# Patient Record
Sex: Female | Born: 1980
Health system: Southern US, Community
[De-identification: ages and names within clinical notes are randomized; demographics above are authoritative.]

## PROBLEM LIST (undated history)

## (undated) DIAGNOSIS — Z9889 Other specified postprocedural states: Secondary | ICD-10-CM

## (undated) DIAGNOSIS — T8859XA Other complications of anesthesia, initial encounter: Secondary | ICD-10-CM

## (undated) DIAGNOSIS — J302 Other seasonal allergic rhinitis: Secondary | ICD-10-CM

## (undated) DIAGNOSIS — T4145XA Adverse effect of unspecified anesthetic, initial encounter: Secondary | ICD-10-CM

## (undated) DIAGNOSIS — R112 Nausea with vomiting, unspecified: Secondary | ICD-10-CM

## (undated) HISTORY — PX: WISDOM TOOTH EXTRACTION: SHX21

---

## 1998-08-03 ENCOUNTER — Other Ambulatory Visit: Admission: RE | Admit: 1998-08-03 | Discharge: 1998-08-03 | Payer: Self-pay | Admitting: Obstetrics and Gynecology

## 1999-10-02 ENCOUNTER — Other Ambulatory Visit: Admission: RE | Admit: 1999-10-02 | Discharge: 1999-10-02 | Payer: Self-pay | Admitting: Obstetrics and Gynecology

## 2000-12-03 ENCOUNTER — Other Ambulatory Visit: Admission: RE | Admit: 2000-12-03 | Discharge: 2000-12-03 | Payer: Self-pay | Admitting: Obstetrics and Gynecology

## 2003-05-16 ENCOUNTER — Emergency Department (HOSPITAL_COMMUNITY): Admission: EM | Admit: 2003-05-16 | Discharge: 2003-05-16 | Payer: Self-pay

## 2006-11-05 ENCOUNTER — Other Ambulatory Visit: Admission: RE | Admit: 2006-11-05 | Discharge: 2006-11-05 | Payer: Self-pay | Admitting: Family Medicine

## 2011-06-17 ENCOUNTER — Other Ambulatory Visit: Payer: Self-pay | Admitting: Family Medicine

## 2011-06-17 ENCOUNTER — Other Ambulatory Visit (HOSPITAL_COMMUNITY)
Admission: RE | Admit: 2011-06-17 | Discharge: 2011-06-17 | Disposition: A | Discharge status: Home / Self Care | Payer: 59 | Source: Ambulatory Visit | Attending: Family Medicine | Admitting: Family Medicine

## 2011-06-17 DIAGNOSIS — Z01419 Encounter for gynecological examination (general) (routine) without abnormal findings: Secondary | ICD-10-CM | POA: Insufficient documentation

## 2013-02-24 ENCOUNTER — Encounter (HOSPITAL_COMMUNITY): Payer: Self-pay

## 2013-02-27 NOTE — H&P (Signed)
Sharon Graham is an 33 y.o. G0 who presents for hysteroscopy, D&C, endometrial ablation due to abnormal uterine bleeding.  The patient reports that since October her menses have become very heavy and irregular. Per patient her menses have always been having lasting 7-8 days with 5 days of HMB requiring 4-5 pads daily. Pt reports passage of grape-sized clots along with dysmenorrhea. However, in the past 2 months, she had 3 menses- each lasting for several days. Per patient, her mother had similar issues had required an ablation. The patient is interested in a permanent option as she has tried OCPs in the past, is not interested in children and will like to have this problem completely resolved.  Current Medications  Taking   Multivitamins Tablet 1 tablet once a day   B Complex Capsule 1 tablet once a day   Vitamin C 500 MG Tablet 1 tablet Once a day   CoQ-10 100 MG Capsule 1 capsule with a meal Once a day   Zyrtec Allergy 10 MG Tablet 1 tablet Once a day   Sudafed 12 Hour 120 MG Tablet Extended Release 12 Hour 1 tablet as needed every 12 hrs   Glucosamine 500 MG Capsule 1 capsule as directed   Aspirin 81 MG Tablet 1 tablet Once a day   Medication List reviewed and reconciled with the patient    Past Medical History  Seasonal Allergies  Complications with anesthesia- hard to wake up PONV (postoperative nausea and vomiting)    motion sickness needs to use Scop patch     Surgical History  Wisdom tooth extraction   Family History  Father: alive  Mother: alive  Paternal Grand Father: deceased, Hypertension, MI(65), diagnosed with HTN  Paternal Grand Mother: deceased, Diabetes, diagnosed with DM  Maternal Grand Father: deceased, Hypertension, Lung cancer, diagnosed with HTN, Lung Ca  Maternal Grand Mother: deceased, Hypertension, Diabetes, Hypercholesterolemia, diagnosed with DM, HTN  Brother 1: deceased, congenital heart defect  denies any GYN family cancer hx.   Social History  General:   Tobacco use  cigarettes: Never smoked Tobacco history last updated 02/02/2013 Alcohol: rare: wine, liquor.  Recreational drug use: no.  Exercise: 1-2 times per week.  Occupation: Biochemist, clinical.  Marital Status: single.  Religion: Methodist.  Seat belt use: yes.    Gyn History  Sexual activity currently sexually active-female partner.  Periods : 14-21 days.  LMP 01/13/13.  Last pap smear date 06/17/11 Normal.  Denies H/O Last mammogram date.  Denies H/O Abnormal pap smear.    OB History  Never been pregnant per patient.    Allergies  N.K.D.A.   Review of Systems  Constitutional: Negative for fever, chills and weight loss.  Respiratory: Negative for cough.   Cardiovascular: Negative for chest pain and palpitations.  Gastrointestinal: Negative for heartburn, nausea, vomiting, abdominal pain, diarrhea and constipation.  Genitourinary: Negative for dysuria, urgency and frequency.  Musculoskeletal: Negative for myalgias.  Skin: Negative for itching and rash.  Neurological: Negative for dizziness and headaches.   Physical Exam  (examination performed on 12/30) GENERAL APPEARANCE alert, oriented, NAD, pleasant.  SKIN: normal, no rash.  LUNGS: clear to auscultation bilaterally, no wheezes, rhonchi, rales.  HEART: no murmurs, regular rate and rhythm.  ABDOMEN: soft and not tender.  FEMALE GENITOURINARY: normal external genitalia, labia - unremarkable, vagina - pink moist mucosa, no lesions or abnormal discharge, cervix - no discharge or lesions or CMT, no polyp or mass seen. , adnexa - no masses or tenderness, uterus -  nontender and normal size on palpation.  EXTREMITIES: no edema present.   Assessment/Plan: 32yG0 who presents for hysterscopy, D&C, Novasure ablation due to AUB. -NPO -LR @ 125cc/hr -No antibiotics indicated -SCDs to OR   Myna HidalgoOZAN, Sharon Graham, M 02/27/2013, 9:27 PM

## 2013-03-04 ENCOUNTER — Ambulatory Visit (HOSPITAL_COMMUNITY): Payer: 59 | Admitting: Certified Registered"

## 2013-03-04 ENCOUNTER — Encounter (HOSPITAL_COMMUNITY): Payer: 59 | Admitting: Certified Registered"

## 2013-03-04 ENCOUNTER — Encounter (HOSPITAL_COMMUNITY): Payer: Self-pay | Admitting: Anesthesiology

## 2013-03-04 ENCOUNTER — Ambulatory Visit (HOSPITAL_COMMUNITY)
Admission: RE | Admit: 2013-03-04 | Discharge: 2013-03-04 | Disposition: A | Discharge status: Home / Self Care | Payer: 59 | Source: Ambulatory Visit | Attending: Obstetrics & Gynecology | Admitting: Obstetrics & Gynecology

## 2013-03-04 ENCOUNTER — Encounter (HOSPITAL_COMMUNITY)
Admission: RE | Disposition: A | Discharge status: Home / Self Care | Payer: Self-pay | Source: Ambulatory Visit | Attending: Obstetrics & Gynecology

## 2013-03-04 DIAGNOSIS — N925 Other specified irregular menstruation: Secondary | ICD-10-CM | POA: Insufficient documentation

## 2013-03-04 DIAGNOSIS — N946 Dysmenorrhea, unspecified: Secondary | ICD-10-CM | POA: Insufficient documentation

## 2013-03-04 DIAGNOSIS — N949 Unspecified condition associated with female genital organs and menstrual cycle: Secondary | ICD-10-CM | POA: Insufficient documentation

## 2013-03-04 DIAGNOSIS — N854 Malposition of uterus: Secondary | ICD-10-CM | POA: Insufficient documentation

## 2013-03-04 DIAGNOSIS — N938 Other specified abnormal uterine and vaginal bleeding: Secondary | ICD-10-CM | POA: Insufficient documentation

## 2013-03-04 HISTORY — DX: Other specified postprocedural states: Z98.890

## 2013-03-04 HISTORY — DX: Other complications of anesthesia, initial encounter: T88.59XA

## 2013-03-04 HISTORY — DX: Other seasonal allergic rhinitis: J30.2

## 2013-03-04 HISTORY — DX: Nausea with vomiting, unspecified: R11.2

## 2013-03-04 HISTORY — PX: HYSTEROSCOPY WITH NOVASURE: SHX5574

## 2013-03-04 HISTORY — DX: Adverse effect of unspecified anesthetic, initial encounter: T41.45XA

## 2013-03-04 LAB — CBC
HEMATOCRIT: 41.2 % (ref 36.0–46.0)
HEMOGLOBIN: 14.2 g/dL (ref 12.0–15.0)
MCH: 29 pg (ref 26.0–34.0)
MCHC: 34.5 g/dL (ref 30.0–36.0)
MCV: 84.1 fL (ref 78.0–100.0)
Platelets: 346 10*3/uL (ref 150–400)
RBC: 4.9 MIL/uL (ref 3.87–5.11)
RDW: 13.5 % (ref 11.5–15.5)
WBC: 6.1 10*3/uL (ref 4.0–10.5)

## 2013-03-04 SURGERY — HYSTEROSCOPY WITH NOVASURE
Anesthesia: General | Site: Vagina

## 2013-03-04 MED ORDER — HYDROCODONE-ACETAMINOPHEN 5-325 MG PO TABS
1.0000 | ORAL_TABLET | Freq: Once | ORAL | Status: AC
  Administered 2013-03-04: 1 via ORAL

## 2013-03-04 MED ORDER — LACTATED RINGERS IV SOLN: INTRAVENOUS | Status: DC

## 2013-03-04 MED ORDER — SCOPOLAMINE 1 MG/3DAYS TD PT72
MEDICATED_PATCH | TRANSDERMAL | Status: AC
  Filled 2013-03-04: qty 1

## 2013-03-04 MED ORDER — GLYCOPYRROLATE 0.2 MG/ML IJ SOLN
INTRAMUSCULAR | Status: DC | PRN
  Administered 2013-03-04: 0.1 mg via INTRAVENOUS

## 2013-03-04 MED ORDER — FENTANYL CITRATE 0.05 MG/ML IJ SOLN
INTRAMUSCULAR | Status: AC
  Administered 2013-03-04: 25 ug via INTRAVENOUS
  Filled 2013-03-04: qty 2

## 2013-03-04 MED ORDER — ONDANSETRON HCL 4 MG/2ML IJ SOLN
INTRAMUSCULAR | Status: DC | PRN
  Administered 2013-03-04: 4 mg via INTRAVENOUS

## 2013-03-04 MED ORDER — FENTANYL CITRATE 0.05 MG/ML IJ SOLN
INTRAMUSCULAR | Status: DC | PRN
  Administered 2013-03-04: 50 ug via INTRAVENOUS
  Administered 2013-03-04: 100 ug via INTRAVENOUS

## 2013-03-04 MED ORDER — PROPOFOL 10 MG/ML IV EMUL
INTRAVENOUS | Status: AC
  Filled 2013-03-04: qty 20

## 2013-03-04 MED ORDER — MIDAZOLAM HCL 2 MG/2ML IJ SOLN
INTRAMUSCULAR | Status: AC
  Filled 2013-03-04: qty 2

## 2013-03-04 MED ORDER — FENTANYL CITRATE 0.05 MG/ML IJ SOLN
INTRAMUSCULAR | Status: AC
  Filled 2013-03-04: qty 2

## 2013-03-04 MED ORDER — FENTANYL CITRATE 0.05 MG/ML IJ SOLN
25.0000 ug | INTRAMUSCULAR | Status: DC | PRN
  Administered 2013-03-04 (×2): 25 ug via INTRAVENOUS

## 2013-03-04 MED ORDER — DEXAMETHASONE SODIUM PHOSPHATE 10 MG/ML IJ SOLN
INTRAMUSCULAR | Status: AC
  Filled 2013-03-04: qty 1

## 2013-03-04 MED ORDER — MIDAZOLAM HCL 2 MG/2ML IJ SOLN
INTRAMUSCULAR | Status: DC | PRN
  Administered 2013-03-04: 1 mg via INTRAVENOUS
  Administered 2013-03-04: 2 mg via INTRAVENOUS

## 2013-03-04 MED ORDER — HYDROCODONE-ACETAMINOPHEN 5-325 MG PO TABS
ORAL_TABLET | ORAL | Status: AC
  Administered 2013-03-04: 1 via ORAL
  Filled 2013-03-04: qty 1

## 2013-03-04 MED ORDER — LIDOCAINE HCL (CARDIAC) 20 MG/ML IV SOLN
INTRAVENOUS | Status: DC | PRN
  Administered 2013-03-04: 100 mg via INTRAVENOUS

## 2013-03-04 MED ORDER — METOCLOPRAMIDE HCL 5 MG/ML IJ SOLN: 10.0000 mg | Freq: Once | INTRAMUSCULAR | Status: DC | PRN

## 2013-03-04 MED ORDER — SCOPOLAMINE 1 MG/3DAYS TD PT72
1.0000 | MEDICATED_PATCH | TRANSDERMAL | Status: DC
  Administered 2013-03-04: 1.5 mg via TRANSDERMAL

## 2013-03-04 MED ORDER — LACTATED RINGERS IV SOLN
INTRAVENOUS | Status: DC
  Administered 2013-03-04: 10:00:00 via INTRAVENOUS

## 2013-03-04 MED ORDER — DIPHENHYDRAMINE HCL 50 MG/ML IJ SOLN
12.5000 mg | Freq: Once | INTRAMUSCULAR | Status: AC
  Administered 2013-03-04: 12.5 mg via INTRAVENOUS

## 2013-03-04 MED ORDER — ONDANSETRON HCL 4 MG/2ML IJ SOLN
INTRAMUSCULAR | Status: AC
  Filled 2013-03-04: qty 2

## 2013-03-04 MED ORDER — DIPHENHYDRAMINE HCL 50 MG/ML IJ SOLN
INTRAMUSCULAR | Status: AC
  Filled 2013-03-04: qty 1

## 2013-03-04 MED ORDER — DEXAMETHASONE SODIUM PHOSPHATE 10 MG/ML IJ SOLN
INTRAMUSCULAR | Status: DC | PRN
  Administered 2013-03-04: 10 mg via INTRAVENOUS

## 2013-03-04 MED ORDER — SODIUM CHLORIDE 0.9 % IR SOLN
Status: DC | PRN
  Administered 2013-03-04: 3000 mL

## 2013-03-04 MED ORDER — LIDOCAINE HCL (PF) 1 % IJ SOLN
INTRAMUSCULAR | Status: AC
  Filled 2013-03-04: qty 5

## 2013-03-04 MED ORDER — PROPOFOL 10 MG/ML IV BOLUS
INTRAVENOUS | Status: DC | PRN
  Administered 2013-03-04: 100 mg via INTRAVENOUS
  Administered 2013-03-04: 50 mg via INTRAVENOUS
  Administered 2013-03-04: 200 mg via INTRAVENOUS

## 2013-03-04 MED ORDER — KETOROLAC TROMETHAMINE 30 MG/ML IJ SOLN
INTRAMUSCULAR | Status: AC
  Administered 2013-03-04: 30 mg via INTRAVENOUS
  Filled 2013-03-04: qty 1

## 2013-03-04 MED ORDER — MEPERIDINE HCL 25 MG/ML IJ SOLN: 6.2500 mg | INTRAMUSCULAR | Status: DC | PRN

## 2013-03-04 MED ORDER — KETOROLAC TROMETHAMINE 30 MG/ML IJ SOLN
30.0000 mg | Freq: Once | INTRAMUSCULAR | Status: AC
  Administered 2013-03-04: 30 mg via INTRAVENOUS

## 2013-03-04 SURGICAL SUPPLY — 15 items
ABLATOR ENDOMETRIAL BIPOLAR (ABLATOR) IMPLANT
CANISTER SUCT 3000ML (MISCELLANEOUS) ×2 IMPLANT
CATH ROBINSON RED A/P 16FR (CATHETERS) ×2 IMPLANT
CLOTH BEACON ORANGE TIMEOUT ST (SAFETY) ×2 IMPLANT
CONTAINER PREFILL 10% NBF 60ML (FORM) ×4 IMPLANT
DILATOR CANAL MILEX (MISCELLANEOUS) IMPLANT
DRSG TELFA 3X8 NADH (GAUZE/BANDAGES/DRESSINGS) ×2 IMPLANT
GLOVE BIOGEL PI IND STRL 6.5 (GLOVE) ×2 IMPLANT
GLOVE BIOGEL PI INDICATOR 6.5 (GLOVE) ×2
GLOVE ECLIPSE 6.5 STRL STRAW (GLOVE) ×2 IMPLANT
GOWN STRL REIN XL XLG (GOWN DISPOSABLE) ×4 IMPLANT
PACK HYSTEROSCOPY LF (CUSTOM PROCEDURE TRAY) ×2 IMPLANT
PAD OB MATERNITY 4.3X12.25 (PERSONAL CARE ITEMS) ×2 IMPLANT
TOWEL OR 17X24 6PK STRL BLUE (TOWEL DISPOSABLE) ×4 IMPLANT
WATER STERILE IRR 1000ML POUR (IV SOLUTION) ×2 IMPLANT

## 2013-03-04 NOTE — Anesthesia Postprocedure Evaluation (Signed)
Anesthesia Post Note  Patient: Sharon Graham  Procedure(s) Performed: Procedure(s) (LRB): HYSTEROSCOPY WITH NOVASURE (N/A)  Anesthesia type: General  Patient location: PACU  Post pain: Pain level controlled  Post assessment: Post-op Vital signs reviewed  Last Vitals:  Filed Vitals:   03/04/13 1119  BP: 121/77  Pulse: 68  Temp: 36.6 C  Resp: 17    Post vital signs: Reviewed  Level of consciousness: sedated  Complications: No apparent anesthesia complications

## 2013-03-04 NOTE — Op Note (Signed)
Operative Report  PreOp: Abnormal uterine bleeding, heavy menstrual bleeding PostOp: same Procedure:  Hysteroscopy, Dilation and Curettage, Endometrial ablation Surgeon: Dr. Myna HidalgoJennifer Shawanna Zanders Anesthesia: General Complications:none EBL: Minimal UOP: 30cc  Findings: Retroverted 8.5cm uterus with proliferative endometrium.  Ostia not well visualized secondary to abundant endometrial tissue Specimens: 1) Endocervical curettings 2) Endometrial curettings  Indications: Sharon Graham PaoShearon is an 33 y.o. G0 who presents for hysteroscopy, D&C, endometrial ablation due to abnormal uterine bleeding. The patient reports that since October her menses have become very heavy and irregular. Per patient her menses have always been having lasting 7-8 days with 5 days of HMB requiring 4-5 pads daily. Pt reports passage of grape-sized clots along with dysmenorrhea. However, in the past 2 months, she had 3 menses- each lasting for several days. Per patient, her mother had similar issues had required an ablation. The patient is interested in a permanent option as she has tried OCPs in the past, is not interested in children and will like to have this problem completely resolved.   Procedure: The patient was taken to the operating room where she underwent general anesthesia without difficulty. The patient was placed in a low lithotomy position using Allen stirrups. She was prepped and draped in the normal sterile fashion. The bladder was drained using a red rubber urethral catheter. A sterile speculum was inserted into the vagina. A single tooth tenaculum was placed on the anterior lip of the cervix. The cervical length was sounded to 4cm, the full length was sounded to 8.5cm. The endocervical canal was then serially dilated to 14French using Hank dilators.  The diagnostic hysteroscope was then inserted without difficulty and noted to have the findings as listed above. Using the hysteroscope. The hysteroscope was removed and sharp  curettage was performed. The tissue was sent to pathology.   Attention was then turned to the Novasure. The Novasure was set up according to manufacture instructions. The cavity length was set to 4.5. The Novasure was inserted, seating test performed and the cavity width was noted to be 3.7. Cavity assessment was performed and passed. The device was then activated for 112sec at a power level of 67. Upon completion, the Novasure was removed and the hysteroscope was reinserted. Global ablation was visualized and no uterine perforation was seen. All instrument were then removed. Hemostasis was observed at the cervical site.  The patient was repositioned to the supine position. The patient tolerated the procedure without any complications and taken to recovery in stable condition.   Myna HidalgoJennifer Denaja Verhoeven, DO [phone] (pager) [phone] (office)

## 2013-03-04 NOTE — Anesthesia Preprocedure Evaluation (Addendum)
Anesthesia Evaluation  Patient identified by MRN, date of birth, ID band Patient awake    Reviewed: Allergy & Precautions, H&P , NPO status , Patient's Chart, lab work & pertinent test results  History of Anesthesia Complications (+) PONV and history of anesthetic complications  Airway Mallampati: II TM Distance: >3 FB Neck ROM: Full    Dental no notable dental hx. (+) Teeth Intact   Pulmonary neg pulmonary ROS,  breath sounds clear to auscultation  Pulmonary exam normal       Cardiovascular negative cardio ROS  Rhythm:Regular Rate:Normal     Neuro/Psych negative neurological ROS  negative psych ROS   GI/Hepatic negative GI ROS, Neg liver ROS,   Endo/Other  negative endocrine ROS  Renal/GU negative Renal ROS  negative genitourinary   Musculoskeletal negative musculoskeletal ROS (+)   Abdominal   Peds  Hematology negative hematology ROS (+)   Anesthesia Other Findings   Reproductive/Obstetrics AUB                          Anesthesia Physical Anesthesia Plan  ASA: II  Anesthesia Plan: General   Post-op Pain Management:    Induction: Intravenous  Airway Management Planned: LMA  Additional Equipment:   Intra-op Plan:   Post-operative Plan: Extubation in OR  Informed Consent: I have reviewed the patients History and Physical, chart, labs and discussed the procedure including the risks, benefits and alternatives for the proposed anesthesia with the patient or authorized representative who has indicated his/her understanding and acceptance.   Dental advisory given  Plan Discussed with: Anesthesiologist, CRNA and Surgeon  Anesthesia Plan Comments:         Anesthesia Quick Evaluation

## 2013-03-04 NOTE — Interval H&P Note (Signed)
History and Physical Interval Note:  03/04/2013 9:44 AM  Sharon Graham  has presented today for surgery, with the diagnosis of Abnormal Uterine Bleeding  The various methods of treatment have been discussed with the patient and family. After consideration of risks, benefits and other options for treatment, the patient has consented to  Procedure(s): HYSTEROSCOPY WITH NOVASURE (N/A) as a surgical intervention .  The patient's history has been reviewed, patient examined, no change in status, stable for surgery.  I have reviewed the patient's chart and labs.  Questions were answered to the patient's satisfaction.     Myna HidalgoZAN, Caius Silbernagel, M

## 2013-03-04 NOTE — Discharge Instructions (Signed)
HOME INSTRUCTIONS  Please note any unusual or excessive bleeding, pain, swelling. Mild dizziness or drowsiness are normal for about 24 hours after surgery.   Shower when comfortable  Restrictions: No driving for 24 hours or while taking pain medications.  Activity:  Nothing in vagina (no tampons, douching, or intercourse) x 2 weeks; no tub baths for 2 weeks Vaginal spotting is expected but if your bleeding is heavy, period like,  please call the office   Incision: the bandaids will fall off when they are ready to; you may clean your incision with mild soap and water but do not rub or scrub the incision site.  You may experience slight bloody drainage from your incision periodically.  This is normal.  If you experience a large amount of drainage or the incision opens, please call your physician who will likely direct you to the emergency department.  Diet:  You may eat whatever you want.  Do not eat large meals.  Eat small frequent meals throughout the day.  Continue to drink a good amount of water at least 6-8 glasses of water per day, hydration is very important for the healing process.  Pain Management: Take Aleve or Tylenol as needed for pain management  Alcohol -- Avoid for 24 hours and while taking pain medications.  Nausea: Take sips of ginger ale or soda  Fever -- Call physician if temperature over 101 degrees  Follow up:  If you do not already have a follow up appointment scheduled, please call the office at [phone].  If you experience fever (a temperature greater than 100.4), pain unrelieved by pain medication, shortness of breath, swelling of a single leg, or any other symptoms which are concerning to you please the office immediately.

## 2013-03-04 NOTE — Transfer of Care (Signed)
Immediate Anesthesia Transfer of Care Note  Patient: Sharon Graham  Procedure(s) Performed: Procedure(s): HYSTEROSCOPY WITH NOVASURE (N/A)  Patient Location: PACU  Anesthesia Type:General  Level of Consciousness: awake, alert  and oriented  Airway & Oxygen Therapy: Patient Spontanous Breathing and Patient connected to face mask oxygen  Post-op Assessment: Report given to PACU RN and Post -op Vital signs reviewed and stable  Post vital signs: Reviewed and stable  Complications: No apparent anesthesia complications

## 2013-03-07 ENCOUNTER — Encounter (HOSPITAL_COMMUNITY): Payer: Self-pay | Admitting: Obstetrics & Gynecology

## 2016-02-22 DIAGNOSIS — M9903 Segmental and somatic dysfunction of lumbar region: Secondary | ICD-10-CM | POA: Diagnosis not present

## 2016-02-22 DIAGNOSIS — M9904 Segmental and somatic dysfunction of sacral region: Secondary | ICD-10-CM | POA: Diagnosis not present

## 2016-02-22 DIAGNOSIS — M9905 Segmental and somatic dysfunction of pelvic region: Secondary | ICD-10-CM | POA: Diagnosis not present

## 2016-03-11 DIAGNOSIS — J069 Acute upper respiratory infection, unspecified: Secondary | ICD-10-CM | POA: Diagnosis not present

## 2016-03-14 DIAGNOSIS — J069 Acute upper respiratory infection, unspecified: Secondary | ICD-10-CM | POA: Diagnosis not present

## 2016-03-14 DIAGNOSIS — J209 Acute bronchitis, unspecified: Secondary | ICD-10-CM | POA: Diagnosis not present

## 2016-05-09 DIAGNOSIS — M9903 Segmental and somatic dysfunction of lumbar region: Secondary | ICD-10-CM | POA: Diagnosis not present

## 2016-05-09 DIAGNOSIS — M9905 Segmental and somatic dysfunction of pelvic region: Secondary | ICD-10-CM | POA: Diagnosis not present

## 2016-05-09 DIAGNOSIS — M9904 Segmental and somatic dysfunction of sacral region: Secondary | ICD-10-CM | POA: Diagnosis not present

## 2016-06-03 DIAGNOSIS — M9905 Segmental and somatic dysfunction of pelvic region: Secondary | ICD-10-CM | POA: Diagnosis not present

## 2016-06-03 DIAGNOSIS — M9904 Segmental and somatic dysfunction of sacral region: Secondary | ICD-10-CM | POA: Diagnosis not present

## 2016-06-03 DIAGNOSIS — M9903 Segmental and somatic dysfunction of lumbar region: Secondary | ICD-10-CM | POA: Diagnosis not present

## 2016-06-09 DIAGNOSIS — M2042 Other hammer toe(s) (acquired), left foot: Secondary | ICD-10-CM | POA: Diagnosis not present

## 2016-06-09 DIAGNOSIS — B351 Tinea unguium: Secondary | ICD-10-CM | POA: Diagnosis not present

## 2016-06-19 DIAGNOSIS — K529 Noninfective gastroenteritis and colitis, unspecified: Secondary | ICD-10-CM | POA: Diagnosis not present

## 2016-06-30 DIAGNOSIS — B351 Tinea unguium: Secondary | ICD-10-CM | POA: Diagnosis not present

## 2016-07-07 DIAGNOSIS — M9904 Segmental and somatic dysfunction of sacral region: Secondary | ICD-10-CM | POA: Diagnosis not present

## 2016-07-07 DIAGNOSIS — M9905 Segmental and somatic dysfunction of pelvic region: Secondary | ICD-10-CM | POA: Diagnosis not present

## 2016-07-07 DIAGNOSIS — M9903 Segmental and somatic dysfunction of lumbar region: Secondary | ICD-10-CM | POA: Diagnosis not present

## 2016-07-18 DIAGNOSIS — M9903 Segmental and somatic dysfunction of lumbar region: Secondary | ICD-10-CM | POA: Diagnosis not present

## 2016-07-18 DIAGNOSIS — M9904 Segmental and somatic dysfunction of sacral region: Secondary | ICD-10-CM | POA: Diagnosis not present

## 2016-07-18 DIAGNOSIS — M9905 Segmental and somatic dysfunction of pelvic region: Secondary | ICD-10-CM | POA: Diagnosis not present

## 2016-07-25 DIAGNOSIS — B351 Tinea unguium: Secondary | ICD-10-CM | POA: Diagnosis not present

## 2016-08-06 DIAGNOSIS — M9905 Segmental and somatic dysfunction of pelvic region: Secondary | ICD-10-CM | POA: Diagnosis not present

## 2016-08-06 DIAGNOSIS — M9903 Segmental and somatic dysfunction of lumbar region: Secondary | ICD-10-CM | POA: Diagnosis not present

## 2016-08-06 DIAGNOSIS — M9904 Segmental and somatic dysfunction of sacral region: Secondary | ICD-10-CM | POA: Diagnosis not present

## 2016-08-08 DIAGNOSIS — M9903 Segmental and somatic dysfunction of lumbar region: Secondary | ICD-10-CM | POA: Diagnosis not present

## 2016-08-08 DIAGNOSIS — M9904 Segmental and somatic dysfunction of sacral region: Secondary | ICD-10-CM | POA: Diagnosis not present

## 2016-08-08 DIAGNOSIS — M9905 Segmental and somatic dysfunction of pelvic region: Secondary | ICD-10-CM | POA: Diagnosis not present

## 2016-08-18 DIAGNOSIS — B351 Tinea unguium: Secondary | ICD-10-CM | POA: Diagnosis not present

## 2016-08-22 DIAGNOSIS — Z79899 Other long term (current) drug therapy: Secondary | ICD-10-CM | POA: Diagnosis not present

## 2016-08-29 DIAGNOSIS — M9905 Segmental and somatic dysfunction of pelvic region: Secondary | ICD-10-CM | POA: Diagnosis not present

## 2016-08-29 DIAGNOSIS — M9903 Segmental and somatic dysfunction of lumbar region: Secondary | ICD-10-CM | POA: Diagnosis not present

## 2016-08-29 DIAGNOSIS — M9904 Segmental and somatic dysfunction of sacral region: Secondary | ICD-10-CM | POA: Diagnosis not present

## 2016-09-26 DIAGNOSIS — M9903 Segmental and somatic dysfunction of lumbar region: Secondary | ICD-10-CM | POA: Diagnosis not present

## 2016-09-26 DIAGNOSIS — M9905 Segmental and somatic dysfunction of pelvic region: Secondary | ICD-10-CM | POA: Diagnosis not present

## 2016-09-26 DIAGNOSIS — M9904 Segmental and somatic dysfunction of sacral region: Secondary | ICD-10-CM | POA: Diagnosis not present

## 2016-09-29 DIAGNOSIS — M9903 Segmental and somatic dysfunction of lumbar region: Secondary | ICD-10-CM | POA: Diagnosis not present

## 2016-09-29 DIAGNOSIS — M9905 Segmental and somatic dysfunction of pelvic region: Secondary | ICD-10-CM | POA: Diagnosis not present

## 2016-09-29 DIAGNOSIS — M9904 Segmental and somatic dysfunction of sacral region: Secondary | ICD-10-CM | POA: Diagnosis not present

## 2016-10-22 DIAGNOSIS — B351 Tinea unguium: Secondary | ICD-10-CM | POA: Diagnosis not present

## 2017-01-28 DIAGNOSIS — M9905 Segmental and somatic dysfunction of pelvic region: Secondary | ICD-10-CM | POA: Diagnosis not present

## 2017-01-28 DIAGNOSIS — M9904 Segmental and somatic dysfunction of sacral region: Secondary | ICD-10-CM | POA: Diagnosis not present

## 2017-01-28 DIAGNOSIS — M9903 Segmental and somatic dysfunction of lumbar region: Secondary | ICD-10-CM | POA: Diagnosis not present

## 2017-03-04 DIAGNOSIS — Z0000 Encounter for general adult medical examination without abnormal findings: Secondary | ICD-10-CM | POA: Diagnosis not present

## 2017-03-04 DIAGNOSIS — Z13220 Encounter for screening for lipoid disorders: Secondary | ICD-10-CM | POA: Diagnosis not present

## 2017-03-04 DIAGNOSIS — Z23 Encounter for immunization: Secondary | ICD-10-CM | POA: Diagnosis not present

## 2017-03-11 DIAGNOSIS — M9904 Segmental and somatic dysfunction of sacral region: Secondary | ICD-10-CM | POA: Diagnosis not present

## 2017-03-11 DIAGNOSIS — M9903 Segmental and somatic dysfunction of lumbar region: Secondary | ICD-10-CM | POA: Diagnosis not present

## 2017-03-11 DIAGNOSIS — M9905 Segmental and somatic dysfunction of pelvic region: Secondary | ICD-10-CM | POA: Diagnosis not present

## 2017-03-14 DIAGNOSIS — J209 Acute bronchitis, unspecified: Secondary | ICD-10-CM | POA: Diagnosis not present

## 2017-03-14 DIAGNOSIS — R05 Cough: Secondary | ICD-10-CM | POA: Diagnosis not present

## 2017-04-21 DIAGNOSIS — M9904 Segmental and somatic dysfunction of sacral region: Secondary | ICD-10-CM | POA: Diagnosis not present

## 2017-04-21 DIAGNOSIS — M9905 Segmental and somatic dysfunction of pelvic region: Secondary | ICD-10-CM | POA: Diagnosis not present

## 2017-04-21 DIAGNOSIS — M9903 Segmental and somatic dysfunction of lumbar region: Secondary | ICD-10-CM | POA: Diagnosis not present

## 2017-07-14 DIAGNOSIS — M9904 Segmental and somatic dysfunction of sacral region: Secondary | ICD-10-CM | POA: Diagnosis not present

## 2017-07-14 DIAGNOSIS — M9903 Segmental and somatic dysfunction of lumbar region: Secondary | ICD-10-CM | POA: Diagnosis not present

## 2017-07-14 DIAGNOSIS — M9905 Segmental and somatic dysfunction of pelvic region: Secondary | ICD-10-CM | POA: Diagnosis not present

## 2017-09-02 DIAGNOSIS — M9904 Segmental and somatic dysfunction of sacral region: Secondary | ICD-10-CM | POA: Diagnosis not present

## 2017-09-02 DIAGNOSIS — M9905 Segmental and somatic dysfunction of pelvic region: Secondary | ICD-10-CM | POA: Diagnosis not present

## 2017-09-02 DIAGNOSIS — M9903 Segmental and somatic dysfunction of lumbar region: Secondary | ICD-10-CM | POA: Diagnosis not present

## 2017-09-11 DIAGNOSIS — M9903 Segmental and somatic dysfunction of lumbar region: Secondary | ICD-10-CM | POA: Diagnosis not present

## 2017-09-11 DIAGNOSIS — M9904 Segmental and somatic dysfunction of sacral region: Secondary | ICD-10-CM | POA: Diagnosis not present

## 2017-09-11 DIAGNOSIS — M9905 Segmental and somatic dysfunction of pelvic region: Secondary | ICD-10-CM | POA: Diagnosis not present

## 2017-09-14 DIAGNOSIS — M9904 Segmental and somatic dysfunction of sacral region: Secondary | ICD-10-CM | POA: Diagnosis not present

## 2017-09-14 DIAGNOSIS — M9905 Segmental and somatic dysfunction of pelvic region: Secondary | ICD-10-CM | POA: Diagnosis not present

## 2017-09-14 DIAGNOSIS — M9903 Segmental and somatic dysfunction of lumbar region: Secondary | ICD-10-CM | POA: Diagnosis not present

## 2017-09-23 DIAGNOSIS — M9903 Segmental and somatic dysfunction of lumbar region: Secondary | ICD-10-CM | POA: Diagnosis not present

## 2017-09-23 DIAGNOSIS — M9904 Segmental and somatic dysfunction of sacral region: Secondary | ICD-10-CM | POA: Diagnosis not present

## 2017-09-23 DIAGNOSIS — M9905 Segmental and somatic dysfunction of pelvic region: Secondary | ICD-10-CM | POA: Diagnosis not present

## 2017-09-25 DIAGNOSIS — M9904 Segmental and somatic dysfunction of sacral region: Secondary | ICD-10-CM | POA: Diagnosis not present

## 2017-09-25 DIAGNOSIS — M9903 Segmental and somatic dysfunction of lumbar region: Secondary | ICD-10-CM | POA: Diagnosis not present

## 2017-09-25 DIAGNOSIS — M9905 Segmental and somatic dysfunction of pelvic region: Secondary | ICD-10-CM | POA: Diagnosis not present

## 2017-09-28 DIAGNOSIS — M9904 Segmental and somatic dysfunction of sacral region: Secondary | ICD-10-CM | POA: Diagnosis not present

## 2017-09-28 DIAGNOSIS — M9903 Segmental and somatic dysfunction of lumbar region: Secondary | ICD-10-CM | POA: Diagnosis not present

## 2017-09-28 DIAGNOSIS — M9905 Segmental and somatic dysfunction of pelvic region: Secondary | ICD-10-CM | POA: Diagnosis not present

## 2017-10-02 DIAGNOSIS — M9904 Segmental and somatic dysfunction of sacral region: Secondary | ICD-10-CM | POA: Diagnosis not present

## 2017-10-02 DIAGNOSIS — M9905 Segmental and somatic dysfunction of pelvic region: Secondary | ICD-10-CM | POA: Diagnosis not present

## 2017-10-02 DIAGNOSIS — M9903 Segmental and somatic dysfunction of lumbar region: Secondary | ICD-10-CM | POA: Diagnosis not present

## 2017-10-05 DIAGNOSIS — M9903 Segmental and somatic dysfunction of lumbar region: Secondary | ICD-10-CM | POA: Diagnosis not present

## 2017-10-05 DIAGNOSIS — M9904 Segmental and somatic dysfunction of sacral region: Secondary | ICD-10-CM | POA: Diagnosis not present

## 2017-10-05 DIAGNOSIS — M9905 Segmental and somatic dysfunction of pelvic region: Secondary | ICD-10-CM | POA: Diagnosis not present

## 2017-10-07 DIAGNOSIS — M9903 Segmental and somatic dysfunction of lumbar region: Secondary | ICD-10-CM | POA: Diagnosis not present

## 2017-10-07 DIAGNOSIS — M9904 Segmental and somatic dysfunction of sacral region: Secondary | ICD-10-CM | POA: Diagnosis not present

## 2017-10-07 DIAGNOSIS — M9905 Segmental and somatic dysfunction of pelvic region: Secondary | ICD-10-CM | POA: Diagnosis not present

## 2017-10-20 DIAGNOSIS — M9903 Segmental and somatic dysfunction of lumbar region: Secondary | ICD-10-CM | POA: Diagnosis not present

## 2017-10-20 DIAGNOSIS — M9904 Segmental and somatic dysfunction of sacral region: Secondary | ICD-10-CM | POA: Diagnosis not present

## 2017-10-20 DIAGNOSIS — M9905 Segmental and somatic dysfunction of pelvic region: Secondary | ICD-10-CM | POA: Diagnosis not present

## 2017-11-17 DIAGNOSIS — M9904 Segmental and somatic dysfunction of sacral region: Secondary | ICD-10-CM | POA: Diagnosis not present

## 2017-11-17 DIAGNOSIS — M9905 Segmental and somatic dysfunction of pelvic region: Secondary | ICD-10-CM | POA: Diagnosis not present

## 2017-11-17 DIAGNOSIS — M9903 Segmental and somatic dysfunction of lumbar region: Secondary | ICD-10-CM | POA: Diagnosis not present

## 2017-11-20 DIAGNOSIS — Z23 Encounter for immunization: Secondary | ICD-10-CM | POA: Diagnosis not present

## 2017-11-27 DIAGNOSIS — M9904 Segmental and somatic dysfunction of sacral region: Secondary | ICD-10-CM | POA: Diagnosis not present

## 2017-11-27 DIAGNOSIS — M9903 Segmental and somatic dysfunction of lumbar region: Secondary | ICD-10-CM | POA: Diagnosis not present

## 2017-11-27 DIAGNOSIS — M9905 Segmental and somatic dysfunction of pelvic region: Secondary | ICD-10-CM | POA: Diagnosis not present

## 2017-12-04 DIAGNOSIS — M9903 Segmental and somatic dysfunction of lumbar region: Secondary | ICD-10-CM | POA: Diagnosis not present

## 2017-12-04 DIAGNOSIS — M9904 Segmental and somatic dysfunction of sacral region: Secondary | ICD-10-CM | POA: Diagnosis not present

## 2017-12-04 DIAGNOSIS — M9905 Segmental and somatic dysfunction of pelvic region: Secondary | ICD-10-CM | POA: Diagnosis not present

## 2017-12-15 DIAGNOSIS — M9905 Segmental and somatic dysfunction of pelvic region: Secondary | ICD-10-CM | POA: Diagnosis not present

## 2017-12-15 DIAGNOSIS — M9904 Segmental and somatic dysfunction of sacral region: Secondary | ICD-10-CM | POA: Diagnosis not present

## 2017-12-15 DIAGNOSIS — M9903 Segmental and somatic dysfunction of lumbar region: Secondary | ICD-10-CM | POA: Diagnosis not present

## 2017-12-21 DIAGNOSIS — M9905 Segmental and somatic dysfunction of pelvic region: Secondary | ICD-10-CM | POA: Diagnosis not present

## 2017-12-21 DIAGNOSIS — M9903 Segmental and somatic dysfunction of lumbar region: Secondary | ICD-10-CM | POA: Diagnosis not present

## 2017-12-21 DIAGNOSIS — M9904 Segmental and somatic dysfunction of sacral region: Secondary | ICD-10-CM | POA: Diagnosis not present

## 2017-12-30 DIAGNOSIS — M9903 Segmental and somatic dysfunction of lumbar region: Secondary | ICD-10-CM | POA: Diagnosis not present

## 2017-12-30 DIAGNOSIS — M9904 Segmental and somatic dysfunction of sacral region: Secondary | ICD-10-CM | POA: Diagnosis not present

## 2017-12-30 DIAGNOSIS — M9905 Segmental and somatic dysfunction of pelvic region: Secondary | ICD-10-CM | POA: Diagnosis not present

## 2017-12-31 DIAGNOSIS — J029 Acute pharyngitis, unspecified: Secondary | ICD-10-CM | POA: Diagnosis not present

## 2018-09-24 ENCOUNTER — Other Ambulatory Visit: Payer: Self-pay | Admitting: *Deleted

## 2018-09-24 DIAGNOSIS — Z20822 Contact with and (suspected) exposure to covid-19: Secondary | ICD-10-CM

## 2018-09-25 LAB — NOVEL CORONAVIRUS, NAA: SARS-CoV-2, NAA: NOT DETECTED

## 2018-10-26 ENCOUNTER — Other Ambulatory Visit: Payer: Self-pay | Admitting: Registered"

## 2018-10-26 DIAGNOSIS — Z20822 Contact with and (suspected) exposure to covid-19: Secondary | ICD-10-CM

## 2018-10-28 LAB — NOVEL CORONAVIRUS, NAA: SARS-CoV-2, NAA: NOT DETECTED

## 2018-11-26 ENCOUNTER — Other Ambulatory Visit: Payer: Self-pay

## 2018-11-26 DIAGNOSIS — Z20822 Contact with and (suspected) exposure to covid-19: Secondary | ICD-10-CM

## 2018-11-27 LAB — NOVEL CORONAVIRUS, NAA: SARS-CoV-2, NAA: NOT DETECTED

## 2018-12-08 ENCOUNTER — Other Ambulatory Visit: Payer: Self-pay

## 2018-12-08 DIAGNOSIS — Z20822 Contact with and (suspected) exposure to covid-19: Secondary | ICD-10-CM

## 2018-12-09 LAB — NOVEL CORONAVIRUS, NAA: SARS-CoV-2, NAA: NOT DETECTED

## 2019-02-03 ENCOUNTER — Ambulatory Visit: Payer: 59 | Attending: Internal Medicine

## 2019-02-03 DIAGNOSIS — Z20822 Contact with and (suspected) exposure to covid-19: Secondary | ICD-10-CM

## 2019-02-04 LAB — NOVEL CORONAVIRUS, NAA: SARS-CoV-2, NAA: NOT DETECTED

## 2019-02-25 ENCOUNTER — Ambulatory Visit: Payer: 59 | Attending: Internal Medicine

## 2019-02-25 DIAGNOSIS — Z20822 Contact with and (suspected) exposure to covid-19: Secondary | ICD-10-CM

## 2019-02-27 LAB — NOVEL CORONAVIRUS, NAA: SARS-CoV-2, NAA: NOT DETECTED

## 2019-03-07 ENCOUNTER — Ambulatory Visit: Payer: 59 | Attending: Internal Medicine

## 2019-03-07 DIAGNOSIS — Z20822 Contact with and (suspected) exposure to covid-19: Secondary | ICD-10-CM

## 2019-03-08 LAB — NOVEL CORONAVIRUS, NAA: SARS-CoV-2, NAA: NOT DETECTED

## 2019-03-18 ENCOUNTER — Other Ambulatory Visit: Payer: Self-pay | Admitting: Family Medicine

## 2019-03-18 DIAGNOSIS — Z1231 Encounter for screening mammogram for malignant neoplasm of breast: Secondary | ICD-10-CM

## 2019-03-21 ENCOUNTER — Ambulatory Visit: Payer: 59 | Attending: Internal Medicine

## 2019-03-21 DIAGNOSIS — Z20822 Contact with and (suspected) exposure to covid-19: Secondary | ICD-10-CM

## 2019-03-22 LAB — NOVEL CORONAVIRUS, NAA: SARS-CoV-2, NAA: NOT DETECTED

## 2019-04-19 ENCOUNTER — Telehealth (HOSPITAL_COMMUNITY): Payer: Self-pay

## 2019-04-19 ENCOUNTER — Other Ambulatory Visit: Payer: Self-pay | Admitting: *Deleted

## 2019-04-19 DIAGNOSIS — I83893 Varicose veins of bilateral lower extremities with other complications: Secondary | ICD-10-CM

## 2019-04-19 NOTE — Telephone Encounter (Signed)

## 2019-04-21 ENCOUNTER — Encounter: Payer: Self-pay | Admitting: Vascular Surgery

## 2019-04-21 ENCOUNTER — Ambulatory Visit (HOSPITAL_COMMUNITY)
Admission: RE | Admit: 2019-04-21 | Discharge: 2019-04-21 | Disposition: A | Discharge status: Home / Self Care | Payer: 59 | Source: Ambulatory Visit | Attending: Vascular Surgery | Admitting: Vascular Surgery

## 2019-04-21 ENCOUNTER — Other Ambulatory Visit: Payer: Self-pay

## 2019-04-21 ENCOUNTER — Ambulatory Visit (INDEPENDENT_AMBULATORY_CARE_PROVIDER_SITE_OTHER): Payer: 59 | Admitting: Physician Assistant

## 2019-04-21 DIAGNOSIS — I781 Nevus, non-neoplastic: Secondary | ICD-10-CM | POA: Insufficient documentation

## 2019-04-21 DIAGNOSIS — I872 Venous insufficiency (chronic) (peripheral): Secondary | ICD-10-CM

## 2019-04-21 DIAGNOSIS — I83893 Varicose veins of bilateral lower extremities with other complications: Secondary | ICD-10-CM | POA: Diagnosis not present

## 2019-04-21 NOTE — Progress Notes (Signed)
Requested by:  Aliene Beams, MD [address] [place],  [place] [address]  Reason for consultation: spider veins BLE   History of Present Illness   Sharon Graham is a 39 y.o. (March 31, 1980) female who presents to evaluate for venous disease after bilateral lower extremity venous reflux study.  Over the past couple months she has noticed an increase in prominence of spider veins scattered on bilateral lower extremities especially left lower extremity.  She also has noticed more aching and heavy feeling in her legs after being on her feet or sitting at her desk throughout the day.  Her occupation requires her to be standing or sitting at a desk throughout the day.  When she gets home she is able to elevate her legs which alleviates symptoms.  She has not tried compression stockings in the past.  She also denies any history of DVT, venous ulceration, or prior vein procedures.  She denies tobacco use or exogenous hormonal therapy.    Past Medical History:  Diagnosis Date  . Complication of anesthesia    hard to wake up   . PONV (postoperative nausea and vomiting)    motion sickness needs to use Scop patch  . Seasonal allergies     Past Surgical History:  Procedure Laterality Date  . HYSTEROSCOPY WITH NOVASURE N/A 03/04/2013   Procedure: HYSTEROSCOPY WITH NOVASURE;  Surgeon: Sharon Seller, DO;  Location: [facility];  Service: Gynecology;  Laterality: N/A;  . WISDOM TOOTH EXTRACTION      Social History   Socioeconomic History  . Marital status: Married    Spouse name: Not on file  . Number of children: Not on file  . Years of education: Not on file  . Highest education level: Not on file  Occupational History  . Not on file  Tobacco Use  . Smoking status: Never Smoker  . Smokeless tobacco: Never Used  Substance and Sexual Activity  . Alcohol use: Yes    Comment: occ  . Drug use: No  . Sexual activity: Not on file  Other Topics Concern  . Not on file  Social History  Narrative  . Not on file   Social Determinants of Health   Financial Resource Strain:   . Difficulty of Paying Living Expenses: Not on file  Food Insecurity:   . Worried About Programme researcher, broadcasting/film/video in the Last Year: Not on file  . Ran Out of Food in the Last Year: Not on file  Transportation Needs:   . Lack of Transportation (Medical): Not on file  . Lack of Transportation (Non-Medical): Not on file  Physical Activity:   . Days of Exercise per Week: Not on file  . Minutes of Exercise per Session: Not on file  Stress:   . Feeling of Stress : Not on file  Social Connections:   . Frequency of Communication with Friends and Family: Not on file  . Frequency of Social Gatherings with Friends and Family: Not on file  . Attends Religious Services: Not on file  . Active Member of Clubs or Organizations: Not on file  . Attends Banker Meetings: Not on file  . Marital Status: Not on file  Intimate Partner Violence:   . Fear of Current or Ex-Partner: Not on file  . Emotionally Abused: Not on file  . Physically Abused: Not on file  . Sexually Abused: Not on file   History reviewed. No pertinent family history.  Current Outpatient Medications  Medication Sig Dispense Refill  . aspirin 81 MG tablet Take 81 mg by mouth daily.    Marland Kitchen b complex vitamins tablet Take 1 tablet by mouth daily.    . cetirizine (ZYRTEC) 10 MG tablet Take 10 mg by mouth daily.    . Coenzyme Q10 10 MG capsule Take 10 mg by mouth daily.    . Multiple Vitamin (MULTIVITAMIN WITH MINERALS) TABS tablet Take 1 tablet by mouth daily.    . pseudoephedrine (SUDAFED) 30 MG tablet Take 30 mg by mouth every 4 (four) hours as needed for congestion.     No current facility-administered medications for this visit.    No Known Allergies  REVIEW OF SYSTEMS (negative unless checked):   Cardiac:  []  Chest pain or chest pressure? []  Shortness of breath upon activity? []  Shortness of breath when lying flat? []   Irregular heart rhythm?  Vascular:  []  Pain in calf, thigh, or hip brought on by walking? []  Pain in feet at night that wakes you up from your sleep? []  Blood clot in your veins? [x]  Leg swelling?  Pulmonary:  []  Oxygen at home? []  Productive cough? []  Wheezing?  Neurologic:  []  Sudden weakness in arms or legs? []  Sudden numbness in arms or legs? []  Sudden onset of difficult speaking or slurred speech? []  Temporary loss of vision in one eye? []  Problems with dizziness?  Gastrointestinal:  []  Blood in stool? []  Vomited blood?  Genitourinary:  []  Burning when urinating? []  Blood in urine?  Psychiatric:  []  Major depression  Hematologic:  []  Bleeding problems? []  Problems with blood clotting?  Dermatologic:  []  Rashes or ulcers?  Constitutional:  []  Fever or chills?  Ear/Nose/Throat:  []  Change in hearing? []  Nose bleeds? []  Sore throat?  Musculoskeletal:  []  Back pain? [x]  Joint pain? []  Muscle pain?   Physical Examination     Vitals:   04/21/19 1501  BP: 134/87  Pulse: 68  Resp: 14  Temp: 97.9 F (36.6 C)  TempSrc: Temporal  SpO2: 97%  Weight: 226 lb (102.5 kg)  Height: 5\' 4"  (1.626 m)   Body mass index is 38.79 kg/m.  General Alert, O x 3, WD, NAD  Head [place]/AT,    Neck Supple, mid-line trachea,    Pulmonary Sym exp, good B air movt,  Cardiac RRR, Nl S1, S2,   Vascular Vessel Right Left  Radial Palpable Palpable  DP Palpable Palpable    Gastro- intestinal soft, non-distended, non-tender to palpation,   Musculo- skeletal M/S 5/5 throughout  , Extremities without ischemic changes  , No edema present, No apparent ropey varicosities however scattered clusters of spider veins notably in left calf left lateral lower leg right medial thigh, No Lipodermatosclerosis present  Neurologic Cranial nerves 2-12 intact ,   Psychiatric Judgement intact, Mood & affect appropriate for pt's clinical situation  Dermatologic See M/S exam for extremity  exam, No rashes otherwise noted  Lymphatic  Palpable lymph nodes: None    Non-invasive Vascular Imaging   BLE Venous Insufficiency Duplex :   RLE:   Negative for DVT and SVT,    GSV reflux in mid thigh only,   Negative for SSV reflux,   deep venous reflux in common femoral vein  LLE:  Negative for DVT and SVT,    GSV reflux from saphenofemoral junction to distal thigh of great saphenous vein,   Negative for SSV reflux,   deep venous reflux in common femoral vein   Medical Decision Making  Sharon Graham is a 39 y.o. female who presents with: LLE chronic venous insufficiency and spider veins   Patient noticing more clusters spider veins especially left lower extremity and also bothered by symptoms of aching, heavy, tired feeling of bilateral lower extremities  Venous reflux study negative on right lower extremity  Greater saphenous vein reflux noted from left saphenofemoral junction to distal thigh  She will be measured for knee-high compression stockings   Encouraged elevation of her legs when possible and to avoid periods of prolonged sitting and standing  She may use NSAIDs for discomfort  She will follow-up in 3 months for evaluation of venous insufficiency by Dr. Darrick Penna  Currently patient does not have any interest in sclerotherapy   Emilie Rutter, PA-C [facility] Office: [phone]  04/21/2019, 3:52 PM  Clinic MD: Darrick Penna

## 2019-04-25 ENCOUNTER — Other Ambulatory Visit: Payer: Self-pay

## 2019-04-25 ENCOUNTER — Ambulatory Visit
Admission: RE | Admit: 2019-04-25 | Discharge: 2019-04-25 | Disposition: A | Discharge status: Home / Self Care | Payer: 59 | Source: Ambulatory Visit | Attending: Family Medicine | Admitting: Family Medicine

## 2019-04-25 DIAGNOSIS — Z1231 Encounter for screening mammogram for malignant neoplasm of breast: Secondary | ICD-10-CM

## 2019-08-10 ENCOUNTER — Other Ambulatory Visit: Payer: Self-pay

## 2019-08-10 ENCOUNTER — Ambulatory Visit: Payer: 59 | Admitting: Vascular Surgery

## 2019-08-10 VITALS — BP 145/93 | HR 68 | Temp 97.7°F | Resp 18 | Ht 65.5 in | Wt 235.0 lb

## 2019-08-10 DIAGNOSIS — I83813 Varicose veins of bilateral lower extremities with pain: Secondary | ICD-10-CM

## 2019-08-10 NOTE — Progress Notes (Signed)
Patient is a 39 year old female who returns for follow-up today.  She was previously seen by one of our PAs a few months ago for symptomatic varicose veins.  She has pain swelling and aching that occurs in both legs.  The left leg is worse than the right.  She has had some improvement with compression stockings but not complete relief of symptoms.  She states elevation also helps with the leg somewhat.  She has a family history of varicose veins in her mother and father and multiple other relatives.  She has no prior history of DVT.  Past Medical History:  Diagnosis Date  . Complication of anesthesia    hard to wake up   . PONV (postoperative nausea and vomiting)    motion sickness needs to use Scop patch  . Seasonal allergies     Current Outpatient Medications on File Prior to Visit  Medication Sig Dispense Refill  . aspirin 81 MG tablet Take 81 mg by mouth daily.    Marland Kitchen b complex vitamins tablet Take 1 tablet by mouth daily.    . cetirizine (ZYRTEC) 10 MG tablet Take 10 mg by mouth daily.    . Coenzyme Q10 10 MG capsule Take 10 mg by mouth daily.    . Multiple Vitamin (MULTIVITAMIN WITH MINERALS) TABS tablet Take 1 tablet by mouth daily.    . pseudoephedrine (SUDAFED) 30 MG tablet Take 30 mg by mouth every 4 (four) hours as needed for congestion.     No current facility-administered medications on file prior to visit.    Social History   Socioeconomic History  . Marital status: Married    Spouse name: Not on file  . Number of children: Not on file  . Years of education: Not on file  . Highest education level: Not on file  Occupational History  . Not on file  Tobacco Use  . Smoking status: Never Smoker  . Smokeless tobacco: Never Used  Substance and Sexual Activity  . Alcohol use: Yes    Comment: occ  . Drug use: No  . Sexual activity: Not on file  Other Topics Concern  . Not on file  Social History Narrative  . Not on file   Social Determinants of Health   Financial  Resource Strain:   . Difficulty of Paying Living Expenses:   Food Insecurity:   . Worried About Charity fundraiser in the Last Year:   . Arboriculturist in the Last Year:   Transportation Needs:   . Film/video editor (Medical):   Marland Kitchen Lack of Transportation (Non-Medical):   Physical Activity:   . Days of Exercise per Week:   . Minutes of Exercise per Session:   Stress:   . Feeling of Stress :   Social Connections:   . Frequency of Communication with Friends and Family:   . Frequency of Social Gatherings with Friends and Family:   . Attends Religious Services:   . Active Member of Clubs or Organizations:   . Attends Archivist Meetings:   Marland Kitchen Marital Status:   Intimate Partner Violence:   . Fear of Current or Ex-Partner:   . Emotionally Abused:   Marland Kitchen Physically Abused:   . Sexually Abused:    Review of systems: She has no shortness of breath.  She has no chest pain.  Data: Reviewed the patient's previous venous reflux exam dated April 21, 2019.  Showed reflux in the mid right greater saphenous vein with a  4 mm diameter the vein became more dilated in the upper thigh up to 8 mm.  In the left leg there was fairly diffuse reflux throughout the left greater saphenous vein with a 4 to 5 mm diameter.  There was deep vein reflux in the deep system bilaterally.  I confirmed the above findings with the SonoSite at the bedside today.  Assessment: Symptomatic venous reflux in the superficial system left greater than right.  Plan: Consideration for laser ablation left greater saphenous vein pending insurance approval.  Risk benefits possible complications and procedure details were discussed with the patient today including but not limited to bleeding infection DVT nerve injury she understands and agrees to proceed.  Fabienne Bruns, MD [facility] Office: [phone]

## 2019-08-17 ENCOUNTER — Telehealth: Payer: Self-pay | Admitting: *Deleted

## 2019-08-17 NOTE — Telephone Encounter (Signed)
Received telephone call from [facility] (nurse case manager/[employer]) that CPT [phone] L GSV has been denied by [facility] medical director after clinical review.  Misty Stanley states denial was based on vein diameter size being smaller than [employer] requires (must be .55 cm or greater).  Pending authorization [id number] (now denied).  Called Ms. Breunig with [facility] decision of denying CPT [id number].  Recommended she return to [facility] for a repeat venous reflux study in a year or sooner if her symptoms worsen.  Ms. Chicoine verbalized understanding.

## 2019-09-15 ENCOUNTER — Encounter: Payer: Self-pay | Admitting: Vascular Surgery

## 2020-03-29 ENCOUNTER — Other Ambulatory Visit: Payer: Self-pay | Admitting: Family Medicine

## 2020-03-29 DIAGNOSIS — Z1231 Encounter for screening mammogram for malignant neoplasm of breast: Secondary | ICD-10-CM

## 2020-05-18 IMAGING — MG DIGITAL SCREENING BILAT W/ TOMO W/ CAD
8 series · 8 of 24 positions shown · non-contrast
Comparison: None.

CLINICAL DATA: Screening.

EXAM:
DIGITAL SCREENING BILATERAL MAMMOGRAM WITH TOMO AND CAD

[R CC synth-2D]
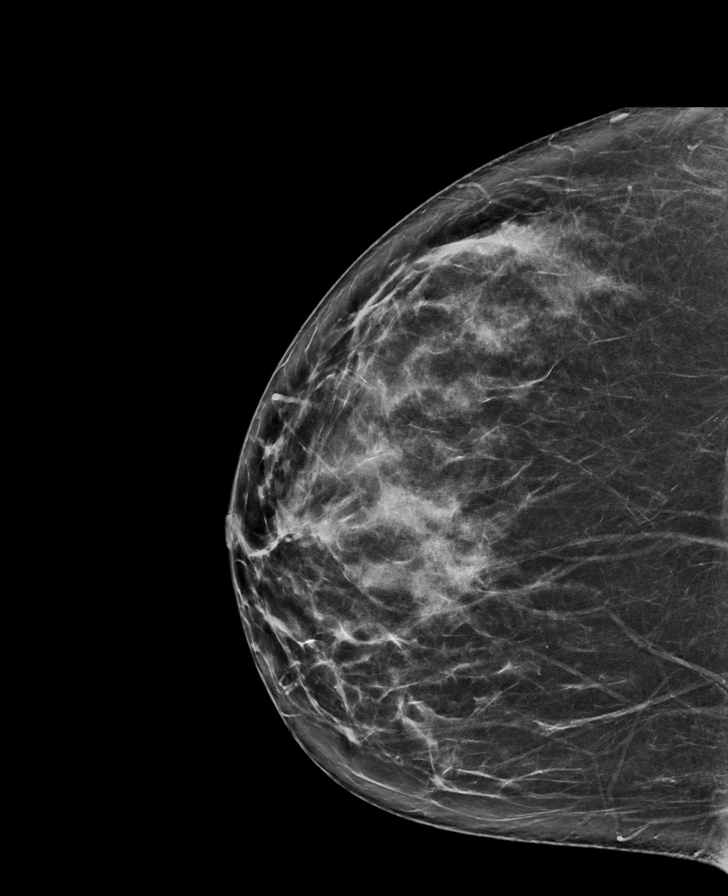

[R MLO synth-2D]
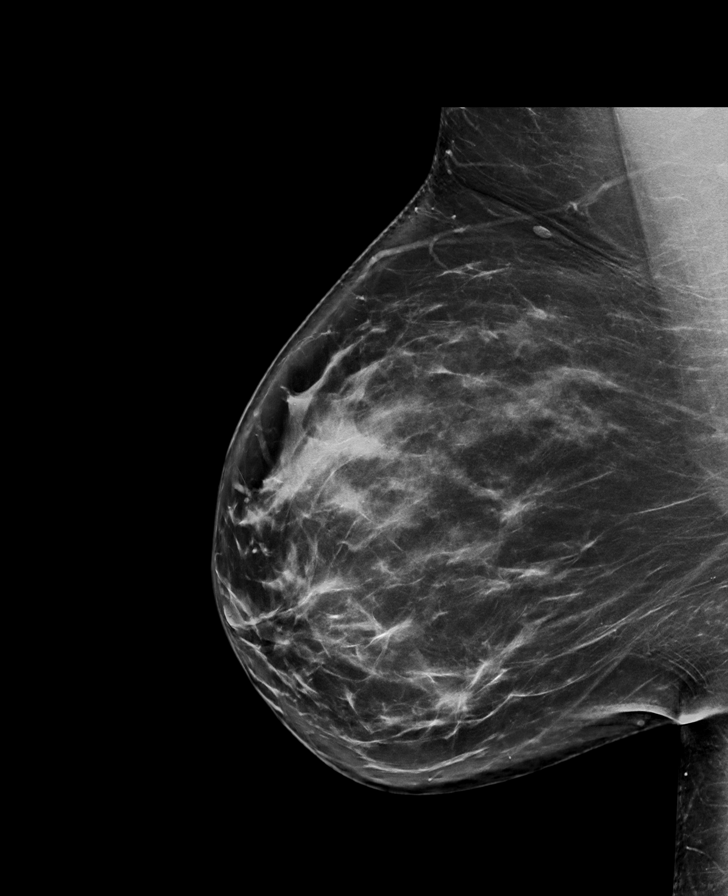

[L CC synth-2D]
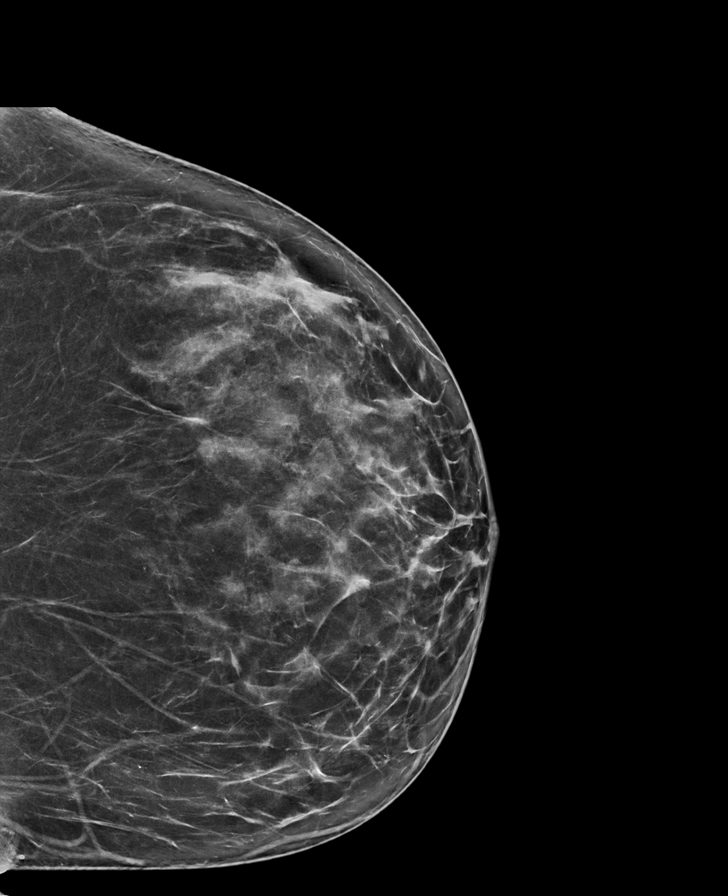

[L MLO synth-2D]
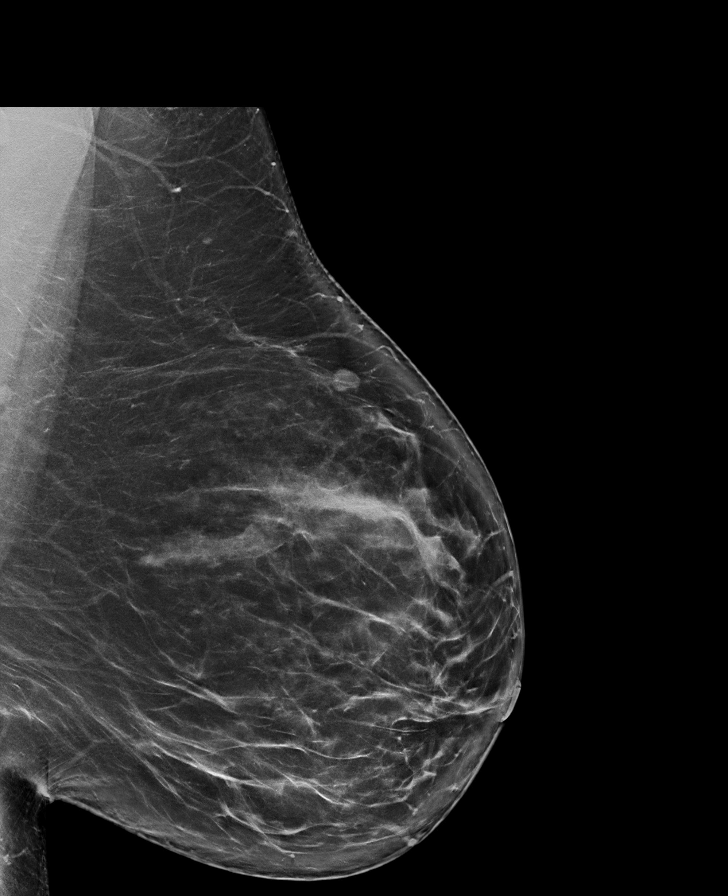

[L CC tomo · tomo slice 40/79.0]
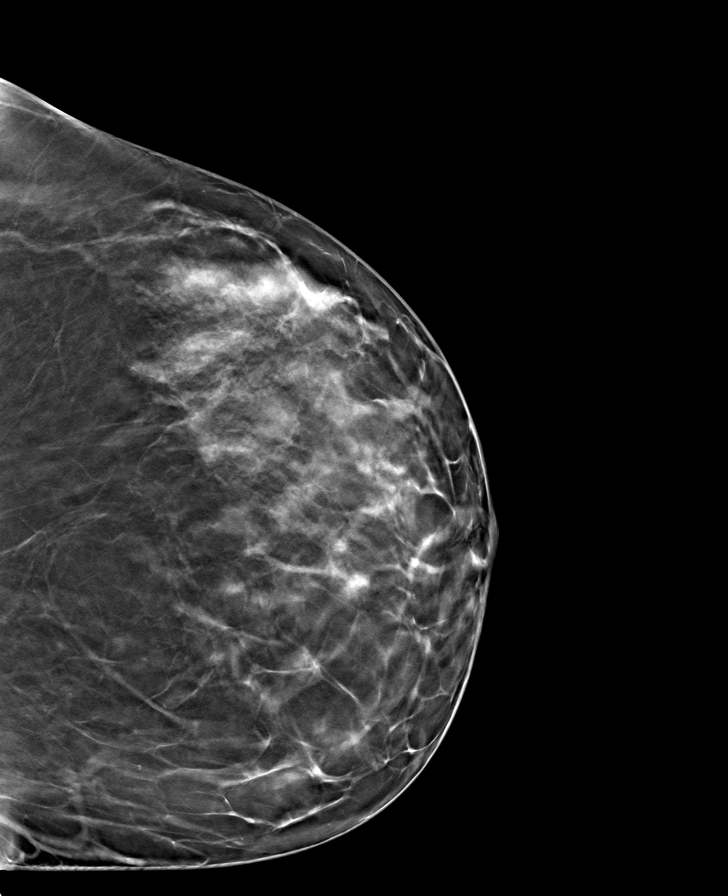

[R MLO tomo · tomo slice 49/98.0]
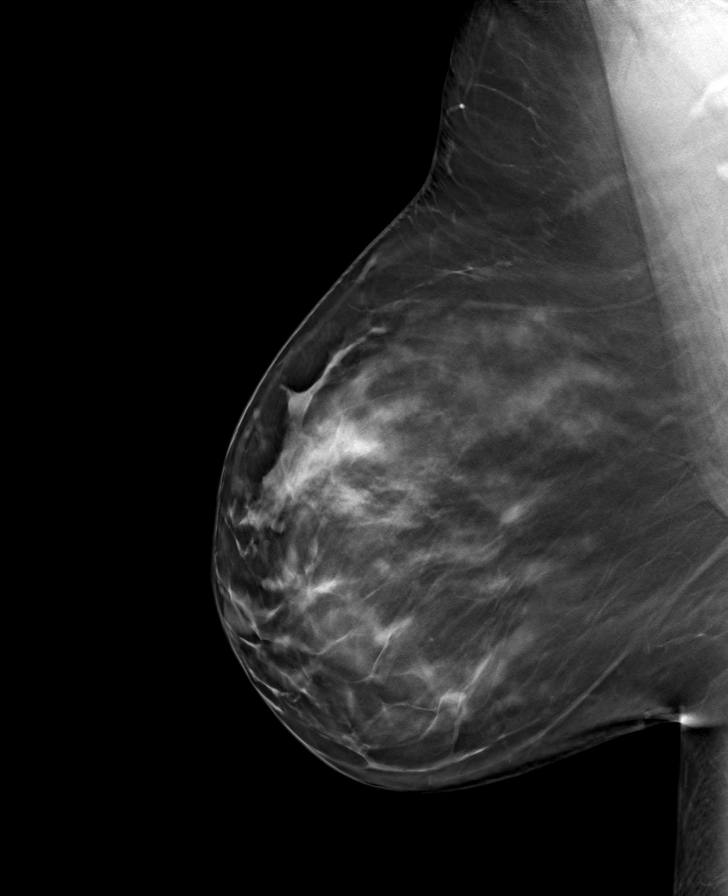

[L MLO tomo · tomo slice 51/102.0]
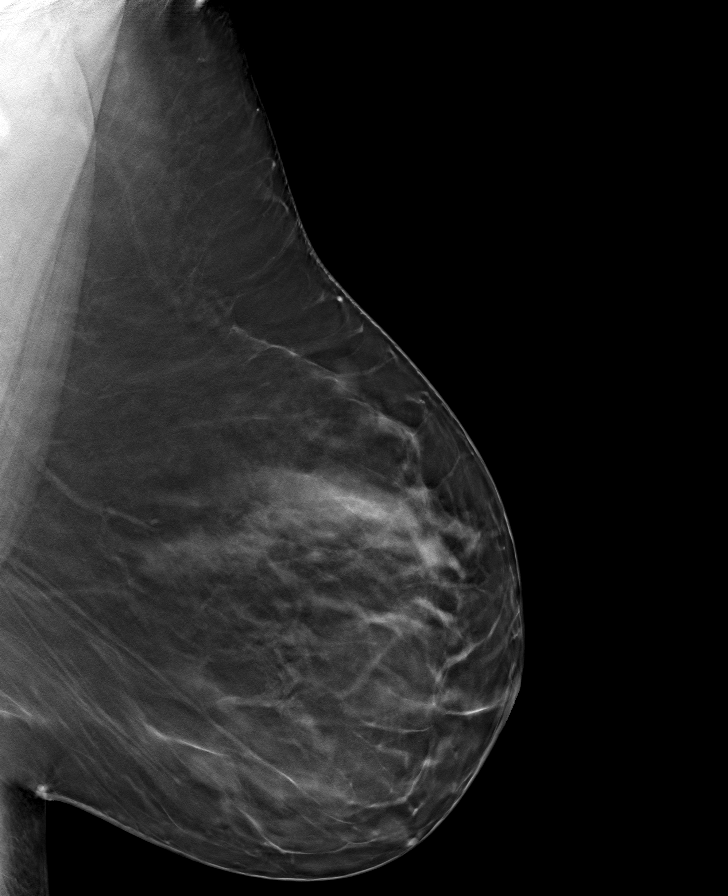

[R CC tomo · tomo slice 40/79.0]
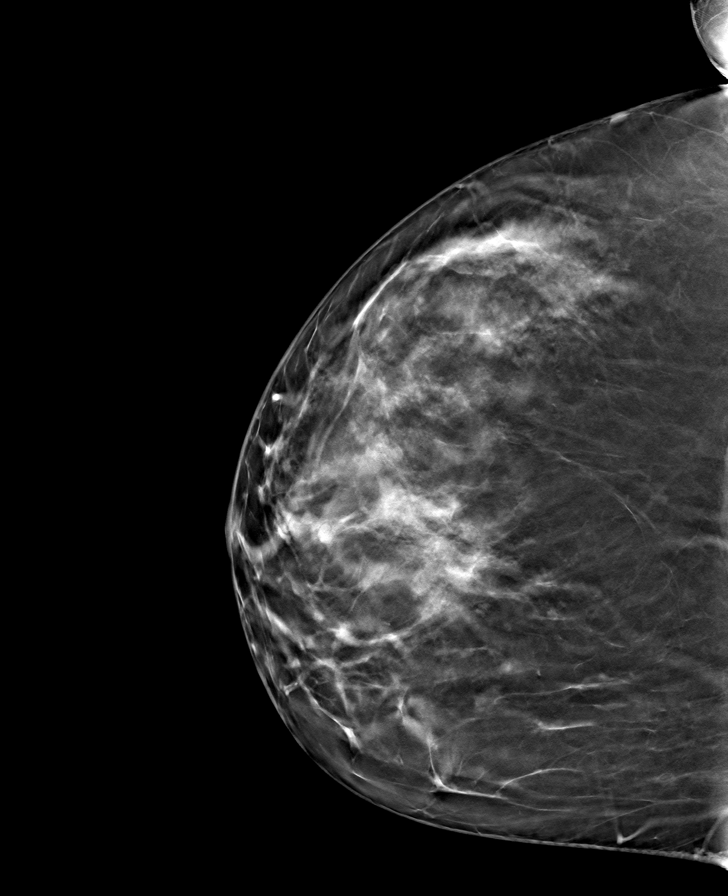

[8 of 24 positions shown; findings below may reference images not displayed]

ACR Breast Density Category c: The breast tissue is heterogeneously
dense, which may obscure small masses
FINDINGS: There are no findings suspicious for malignancy. Images were
processed with CAD.
IMPRESSION: No mammographic evidence of malignancy. A result letter of this
screening mammogram will be mailed directly to the patient.

RECOMMENDATION:
Screening mammogram in one year. (Code:[id number]-[id number])

BI-RADS CATEGORY  1: Negative.

## 2020-05-21 ENCOUNTER — Ambulatory Visit
Admission: RE | Admit: 2020-05-21 | Discharge: 2020-05-21 | Disposition: A | Discharge status: Home / Self Care | Payer: 59 | Source: Ambulatory Visit | Attending: Family Medicine | Admitting: Family Medicine

## 2020-05-21 ENCOUNTER — Other Ambulatory Visit: Payer: Self-pay

## 2020-05-21 DIAGNOSIS — Z1231 Encounter for screening mammogram for malignant neoplasm of breast: Secondary | ICD-10-CM

## 2021-05-21 ENCOUNTER — Encounter: Payer: Self-pay | Admitting: Pulmonary Disease

## 2021-05-21 ENCOUNTER — Ambulatory Visit (INDEPENDENT_AMBULATORY_CARE_PROVIDER_SITE_OTHER): Payer: 59 | Admitting: Pulmonary Disease

## 2021-05-21 VITALS — BP 128/70 | HR 69 | Ht 64.75 in | Wt 246.6 lb

## 2021-05-21 DIAGNOSIS — J4530 Mild persistent asthma, uncomplicated: Secondary | ICD-10-CM | POA: Diagnosis not present

## 2021-05-21 DIAGNOSIS — U099 Post covid-19 condition, unspecified: Secondary | ICD-10-CM | POA: Diagnosis not present

## 2021-05-21 DIAGNOSIS — R0609 Other forms of dyspnea: Secondary | ICD-10-CM | POA: Diagnosis not present

## 2021-05-21 NOTE — Patient Instructions (Signed)
?  Mild persistent asthma +/- COVID-19 long hauler ?--ORDER pulmonary function test ?--CONTINUE Advair 250-50 mcg ONE puff TWICE a day. If symptoms improved, consider step-down ?--CONTINUE Albuterol AS NEEDED for shortness of breath or wheezing ?--ORDER labs: CBC with diff, IgE ? ?Recent weight gain ?--Start daily aerobic exercise for goal 30 minutes ? ?Follow-up with me in June with PFTs prior ? ? ?

## 2021-05-21 NOTE — Progress Notes (Signed)
? ? ?Subjective:  ? ?PATIENT ID: Sharon Graham: female DOB: 1980/04/07, MRN: [id number] ? ? ?HPI ? ?Chief Complaint  ?Patient presents with  ? Consult  ?  Cough since had covid dec 2022  ? ? ?Reason for Visit: New consult for bronchospasm and cough ? ?Ms. Sharon Graham is a 41 year old female never smoker with allergic rhinitis, hyperlipidemia, general anxiety disorder who presents as a consult for bronchospasm and chronic cough. ? ?She was referred by Dr. Tracie Harrier. She was diagnosed with COVID in January and December 2022.  In the last four months after her COVID-19 infection, she reports worsening shortness of breath and coughing at night. Has a albuterol inhaler which she used twice at night. Has difficulty walking up one flight of stairs but does not have to stop. Previously had coughing and wheezing. Started on Advair and was increased to Advair 250-50 a few weeks ago with improvement. On Zyrtec and Flonase. She reports hoarseness that starts in the morning and worsens in the day. Denies history childhood asthma. Reports seasonal bronchitis for one week per year. Also reports weight gain in the last few months that has worsened her breathing as well. ? ? ?   ? View : No data to display.  ?  ?  ?  ? ?Social History: ?Never smoker ?[employer]. Currently deskjob ? ?I have personally reviewed patient's past medical/family/social history, allergies, current medications. ? ?Past Medical History:  ?Diagnosis Date  ? Complication of anesthesia   ? hard to wake up   ? PONV (postoperative nausea and vomiting)   ? motion sickness needs to use Scop patch  ? Seasonal allergies   ?  ? ?History reviewed. No pertinent family history.  ? ?Social History  ? ?Occupational History  ? Not on file  ?Tobacco Use  ? Smoking status: Never  ? Smokeless tobacco: Never  ?Substance and Sexual Activity  ? Alcohol use: Yes  ?  Comment: occ  ? Drug use: No  ? Sexual activity: Not on file  ? ? ?Allergies  ?Allergen Reactions  ?  Ultram [Tramadol] Nausea And Vomiting  ? Hydrocodone-Acetaminophen Nausea And Vomiting  ? Oxycodone-Acetaminophen Nausea And Vomiting  ? Tramadol Hcl Nausea And Vomiting  ?  ? ?Outpatient Medications Prior to Visit  ?Medication Sig Dispense Refill  ? ADVAIR DISKUS 250-50 MCG/ACT AEPB Inhale 1 puff into the lungs 2 (two) times daily.    ? aspirin 81 MG tablet Take 81 mg by mouth daily.    ? b complex vitamins tablet Take 1 tablet by mouth daily.    ? cetirizine (ZYRTEC) 10 MG tablet Take 10 mg by mouth daily.    ? citalopram (CELEXA) 40 MG tablet Take 40 mg by mouth daily.    ? Coenzyme Q10 10 MG capsule Take 10 mg by mouth daily.    ? fluticasone (FLONASE) 50 MCG/ACT nasal spray 1 spray in each nostril    ? hydrOXYzine (ATARAX) 10 MG tablet 1-2 TABLETS ORALLY EVERY 8 HRS AS NEEDED FOR ANXIETY 90 DAYS    ? Magnesium 400 MG CAPS See admin instructions.    ? Multiple Vitamin (MULTIVITAMIN WITH MINERALS) TABS tablet Take 1 tablet by mouth daily.    ? Probiotic Product (PROBIOTIC DAILY PO)     ? pseudoephedrine (SUDAFED) 30 MG tablet Take 30 mg by mouth every 4 (four) hours as needed for congestion.    ? ?No facility-administered medications prior to visit.  ? ? ?Review of Systems  ?  Constitutional:  Negative for chills, diaphoresis, fever, malaise/fatigue and weight loss.  ?HENT:  Negative for congestion.   ?Respiratory:  Positive for cough, shortness of breath and wheezing. Negative for hemoptysis and sputum production.   ?Cardiovascular:  Negative for chest pain, palpitations and leg swelling.  ? ? ?Objective:  ? ?Vitals:  ? 05/21/21 1521  ?BP: 128/70  ?Pulse: 69  ?SpO2: 97%  ?Weight: 246 lb 9.6 oz (111.9 kg)  ?Height: 5' 4.75" (1.645 m)  ? ?SpO2: 97 % ?O2 Device: None (Room air) ? ?Physical Exam: ?General: Well-appearing, no acute distress ?HENT: [place], AT ?Eyes: EOMI, no scleral icterus ?Respiratory: Clear to auscultation bilaterally.  No crackles, wheezing or rales ?Cardiovascular: RRR, -M/R/G, no  JVD ?Extremities:-Edema,-tenderness ?Neuro: AAO x4, CNII-XII grossly intact ?Psych: Normal mood, normal affect ? ?Data Reviewed: ? ?Imaging: ?None on file ? ?PFT: ?None on file ? ?Labs: ?CBC ?   ?Component Value Date/Time  ? WBC 6.1 03/04/2013 0935  ? RBC 4.90 03/04/2013 0935  ? HGB 14.2 03/04/2013 0935  ? HCT 41.2 03/04/2013 0935  ? PLT 346 03/04/2013 0935  ? MCV 84.1 03/04/2013 0935  ? MCH 29.0 03/04/2013 0935  ? MCHC 34.5 03/04/2013 0935  ? RDW 13.5 03/04/2013 0935  ? ? ?Assessment & Plan:  ? ?Discussion: ?41 year old female never smoker with allergic rhinitis, hyperlipidemia, general anxiety disorder who presents for cough. Responsive to ICS/LABA treatment. Symptoms suggestive of clinical asthma that likely was exacerbated by viral infection. Discussed clinical course and management of asthma including bronchodilator regimen and action plan for exacerbation. ? ? ?Mild persistent asthma +/- COVID-19 long hauler ?--ORDER pulmonary function test ?--CONTINUE Advair 250-50 mcg ONE puff TWICE a day ?--CONTINUE Albuterol AS NEEDED for shortness of breath or wheezing ?--ORDER labs: CBC with diff, IgE ? ?Health Maintenance ?Immunization History  ?Administered Date(s) Administered  ? Influenza Split 11/29/2012, 12/03/2015, 12/01/2017, 12/01/2018, 11/27/2019, 10/29/2020  ? Influenza,inj,quad, With Preservative 12/10/2018  ? Influenza-Unspecified 12/03/2018, 11/27/2019  ? PFIZER(Purple Top)SARS-COV-2 Vaccination 04/06/2019, 05/03/2019, 11/27/2019  ? Tdap 11/05/2006, 03/04/2017  ? ? ?Orders Placed This Encounter  ?Procedures  ? CBC w/Diff  ?  Standing Status:   Future  ?  Number of Occurrences:   1  ?  Standing Expiration Date:   05/21/2022  ? IgE  ?  Standing Status:   Future  ?  Number of Occurrences:   1  ?  Standing Expiration Date:   05/22/2022  ? Pulmonary function test  ?  Standing Status:   Future  ?  Standing Expiration Date:   05/22/2022  ?  Order Specific Question:   Where should this test be performed?  ?  Answer:    [facility]  ?  Order Specific Question:   Full PFT: includes the following: basic spirometry, spirometry pre & post bronchodilator, diffusion capacity (DLCO), lung volumes  ?  Answer:   Full PFT  ?No orders of the defined types were placed in this encounter. ? ? ?No follow-ups on file. After PFTs ? ?I have spent a total time of 45-minutes on the day of the appointment reviewing prior documentation, coordinating care and discussing medical diagnosis and plan with the patient/family. Imaging, labs and tests included in this note have been reviewed and interpreted independently by me. ? ?Dannah Ryles Mechele Collin, MD ?[facility] ?05/21/2021 3:25 PM  ?Office Number [phone] ? ? ?

## 2021-05-22 LAB — IGE: IgE (Immunoglobulin E), Serum: 45 kU/L (ref <=114)

## 2021-05-22 LAB — CBC WITH DIFFERENTIAL/PLATELET
Basophils Absolute: 0 10*3/uL (ref 0.0–0.1)
Basophils Relative: 0.6 % (ref 0.0–3.0)
Eosinophils Absolute: 0.3 10*3/uL (ref 0.0–0.7)
Eosinophils Relative: 3.5 % (ref 0.0–5.0)
HCT: 40 % (ref 36.0–46.0)
Hemoglobin: 13.4 g/dL (ref 12.0–15.0)
Lymphocytes Relative: 30.3 % (ref 12.0–46.0)
Lymphs Abs: 2.3 10*3/uL (ref 0.7–4.0)
MCHC: 33.6 g/dL (ref 30.0–36.0)
MCV: 82.9 fl (ref 78.0–100.0)
Monocytes Absolute: 0.5 10*3/uL (ref 0.1–1.0)
Monocytes Relative: 6.4 % (ref 3.0–12.0)
Neutro Abs: 4.6 10*3/uL (ref 1.4–7.7)
Neutrophils Relative %: 59.2 % (ref 43.0–77.0)
Platelets: 340 10*3/uL (ref 150.0–400.0)
RBC: 4.83 Mil/uL (ref 3.87–5.11)
RDW: 13.5 % (ref 11.5–15.5)
WBC: 7.7 10*3/uL (ref 4.0–10.5)

## 2021-07-29 ENCOUNTER — Ambulatory Visit (INDEPENDENT_AMBULATORY_CARE_PROVIDER_SITE_OTHER): Payer: 59 | Admitting: Pulmonary Disease

## 2021-07-29 ENCOUNTER — Encounter: Payer: Self-pay | Admitting: Pulmonary Disease

## 2021-07-29 ENCOUNTER — Ambulatory Visit: Payer: 59 | Admitting: Pulmonary Disease

## 2021-07-29 VITALS — BP 136/74 | HR 74 | Temp 98.1°F | Ht 64.5 in | Wt 248.6 lb

## 2021-07-29 DIAGNOSIS — J4530 Mild persistent asthma, uncomplicated: Secondary | ICD-10-CM

## 2021-07-29 DIAGNOSIS — R0609 Other forms of dyspnea: Secondary | ICD-10-CM | POA: Diagnosis not present

## 2021-07-29 DIAGNOSIS — U099 Post covid-19 condition, unspecified: Secondary | ICD-10-CM | POA: Diagnosis not present

## 2021-07-29 LAB — PULMONARY FUNCTION TEST
DL/VA % pred: 114 %
DL/VA: 5.03 ml/min/mmHg/L
DLCO cor % pred: 111 %
DLCO cor: 24.64 ml/min/mmHg
DLCO unc % pred: 111 %
DLCO unc: 24.64 ml/min/mmHg
FEF 25-75 Post: 4.89 L/sec
FEF 25-75 Pre: 4.1 L/sec
FEF2575-%Change-Post: 19 %
FEF2575-%Pred-Post: 158 %
FEF2575-%Pred-Pre: 132 %
FEV1-%Change-Post: 6 %
FEV1-%Pred-Post: 106 %
FEV1-%Pred-Pre: 100 %
FEV1-Post: 3.22 L
FEV1-Pre: 3.03 L
FEV1FVC-%Change-Post: 0 %
FEV1FVC-%Pred-Pre: 108 %
FEV6-%Change-Post: 5 %
FEV6-%Pred-Post: 98 %
FEV6-%Pred-Pre: 93 %
FEV6-Post: 3.6 L
FEV6-Pre: 3.42 L
FEV6FVC-%Pred-Post: 102 %
FEV6FVC-%Pred-Pre: 102 %
FVC-%Change-Post: 5 %
FVC-%Pred-Post: 96 %
FVC-%Pred-Pre: 91 %
FVC-Post: 3.6 L
FVC-Pre: 3.42 L
Post FEV1/FVC ratio: 89 %
Post FEV6/FVC ratio: 100 %
Pre FEV1/FVC ratio: 88 %
Pre FEV6/FVC Ratio: 100 %
RV % pred: 100 %
RV: 1.66 L
TLC % pred: 111 %
TLC: 5.73 L

## 2021-07-29 MED ORDER — ALBUTEROL SULFATE HFA 108 (90 BASE) MCG/ACT IN AERS: 2.0000 | INHALATION_SPRAY | Freq: Four times a day (QID) | RESPIRATORY_TRACT | 6 refills | Status: DC | PRN

## 2021-07-29 NOTE — Patient Instructions (Addendum)
  Mild persistent asthma +/- COVID-19 long hauler PFTs normal. No scarring   When air quality improves: --Decrease Advair from 250-50 mcg to the 100-50 strength. Take ONE puff TWICE a day --CONTINUE Albuterol AS NEEDED for shortness of breath or wheezing  Please call if symptoms are stable on the lower Advair dose so we can order more  Hoarseness --Continue reflux medication --Continue flonase --Can refer to ENT in the future if persistent  Follow-up with me in 6 months

## 2021-07-29 NOTE — Progress Notes (Signed)
Full PFT Performed Today  
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
## 2021-07-29 NOTE — Patient Instructions (Signed)
Full PFT Performed Today  

## 2022-02-24 ENCOUNTER — Other Ambulatory Visit: Payer: Self-pay | Admitting: Pulmonary Disease

## 2022-04-29 ENCOUNTER — Other Ambulatory Visit: Payer: Self-pay | Admitting: Family Medicine

## 2022-04-29 DIAGNOSIS — Z1231 Encounter for screening mammogram for malignant neoplasm of breast: Secondary | ICD-10-CM

## 2022-06-18 ENCOUNTER — Ambulatory Visit
Admission: RE | Admit: 2022-06-18 | Discharge: 2022-06-18 | Disposition: A | Discharge status: Home / Self Care | Payer: 59 | Source: Ambulatory Visit | Attending: Family Medicine | Admitting: Family Medicine

## 2022-06-18 DIAGNOSIS — Z1231 Encounter for screening mammogram for malignant neoplasm of breast: Secondary | ICD-10-CM

## 2022-08-29 ENCOUNTER — Other Ambulatory Visit: Payer: Self-pay | Admitting: Pulmonary Disease

## 2023-03-25 ENCOUNTER — Other Ambulatory Visit: Payer: Self-pay | Admitting: Pulmonary Disease
